# Patient Record
Sex: Female | Born: 1990 | Hispanic: Yes | Marital: Single | Smoking: Never smoker
Health system: Southern US, Community
[De-identification: ages and names within clinical notes are randomized; demographics above are authoritative.]

## PROBLEM LIST (undated history)

## (undated) ENCOUNTER — Inpatient Hospital Stay: Payer: Self-pay

## (undated) DIAGNOSIS — Z789 Other specified health status: Secondary | ICD-10-CM

## (undated) HISTORY — PX: NO PAST SURGERIES: SHX2092

---
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
## 2011-05-14 ENCOUNTER — Emergency Department: Payer: Self-pay | Admitting: *Deleted

## 2013-10-07 ENCOUNTER — Emergency Department: Payer: Self-pay | Admitting: Emergency Medicine

## 2014-04-25 ENCOUNTER — Observation Stay: Payer: Self-pay | Admitting: Obstetrics and Gynecology

## 2014-05-05 ENCOUNTER — Inpatient Hospital Stay: Payer: Self-pay | Admitting: Obstetrics and Gynecology

## 2014-05-05 LAB — CBC WITH DIFFERENTIAL/PLATELET
BASOS ABS: 0 10*3/uL (ref 0.0–0.1)
BASOS PCT: 0 %
EOS PCT: 0.2 %
Eosinophil #: 0 10*3/uL (ref 0.0–0.7)
HCT: 35.8 % (ref 35.0–47.0)
HGB: 11.7 g/dL — ABNORMAL LOW (ref 12.0–16.0)
Lymphocyte #: 2.3 10*3/uL (ref 1.0–3.6)
Lymphocyte %: 19.6 %
MCH: 26.5 pg (ref 26.0–34.0)
MCHC: 32.6 g/dL (ref 32.0–36.0)
MCV: 81 fL (ref 80–100)
Monocyte #: 0.7 x10 3/mm (ref 0.2–0.9)
Monocyte %: 5.6 %
NEUTROS ABS: 8.6 10*3/uL — AB (ref 1.4–6.5)
NEUTROS PCT: 74.6 %
Platelet: 130 10*3/uL — ABNORMAL LOW (ref 150–440)
RBC: 4.41 10*6/uL (ref 3.80–5.20)
RDW: 15.7 % — ABNORMAL HIGH (ref 11.5–14.5)
WBC: 11.6 10*3/uL — ABNORMAL HIGH (ref 3.6–11.0)

## 2014-05-05 LAB — GC/CHLAMYDIA PROBE AMP

## 2014-05-06 LAB — HEMATOCRIT: HCT: 31.2 % — ABNORMAL LOW (ref 35.0–47.0)

## 2015-03-11 NOTE — H&P (Signed)
L&D Evaluation:  History Expanded:  HPI 24 yo G3 P1102 who presents with some contractions. No ROM or vaginal bleeding.  Currently 38 2/[redacted] weeks EGA.  Prenatal Care at [facility].    She has had a previous preterm delivery at 35 weeks due to preeclampsia ans then a 37 week delivery. She is on baby asa a day for her risk prevention, Early US shows a fwetal kidney pelvis dilation and hydronephrosis, no genetic testing was recommended.  She is A pos/VI/RI She plans to use depo post delivery. Pt here to be evaluated for labor. she got tdap 4/21   Gravida 3   Term 1   PreTerm 1   Abortion 0   Living 2   Blood Type (Maternal) A positive   Group B Strep Results Maternal (Result >5wks must be treated as unknown) negative   Maternal HIV Negative   Maternal Syphilis Ab Nonreactive   Maternal Varicella Immune   Rubella Results (Maternal) immune   Maternal T-Dap Immune   [facility]   Presents with contractions   Patient's Medical History No Chronic Illness   Patient's Surgical History none   Medications Pre [place]atal Vitamins  baby asa   Allergies Intolerence Fe supplents increase her n/v   Social History none   Family History Non-Contributory   ROS:  ROS All systems were reviewed.  HEENT, CNS, GI, GU, Respiratory, CV, Renal and Musculoskeletal systems were found to be normal.   Exam:  Vital Signs stable   Urine Protein not completed   General no apparent distress   Mental Status clear   Chest clear   Heart normal sinus rhythm   Abdomen gravid, tender with contractions   Estimated Fetal Weight Average for gestational age   Fetal Position v   Fundal Height term   Back no CVAT   Edema no edema   Pelvic no external lesions, 5/80/-1   Mebranes Intact   FHT CAT 1   Ucx regular   Skin dry   Impression:  Impression early labor   Plan:  Plan EFM/NST, monitor contractions and for cervical change, monitor BP, fluids   Comments Labor plans  discussed Analgesia as needed AROM- clear   Electronic Signatures: Letitia LibraHarris, Robert Paul (MD)  (Signed 05-Jul-15 10:04)  Authored: L&D Evaluation   Last Updated: 05-Jul-15 10:04 by Letitia LibraHarris, Robert Paul (MD)

## 2015-03-11 NOTE — H&P (Signed)
L&D Evaluation:  History Expanded:  HPI 24 yo G3 P1102 who presnts with some contractions. She has had a previous preterm delivery at 35 weeks due to preeclampsia ans then a 37 week delivery. She is on baby asa a day for her risk prevention, Early US shows a fwetal kidney pelvis dilation and hydronephrosis, no genetic testing was recommended.  She is A pos/VI/RI She plans to use depo post delivery. Pt here to be evaluated for labor. she got tdap 4/21   Gravida 3   Term 1   PreTerm 1   Abortion 0   Living 2   Blood Type (Maternal) A positive   Group B Strep Results Maternal (Result >5wks must be treated as unknown) negative   Maternal HIV Negative   Maternal Syphilis Ab Nonreactive   Maternal Varicella Immune   Rubella Results (Maternal) immune   Maternal T-Dap Immune   [facility]   Presents with contractions   Patient's Medical History No Chronic Illness   Patient's Surgical History none   Medications Pre [place]atal Vitamins  baby asa   Allergies Intolersnce Fe supplents in crease her n/V   Social History none   Family History Non-Contributory   ROS:  ROS All systems were reviewed.  HEENT, CNS, GI, GU, Respiratory, CV, Renal and Musculoskeletal systems were found to be normal.   Exam:  Vital Signs stable   Urine Protein not completed   General no apparent distress   Mental Status clear   Chest clear   Heart normal sinus rhythm   Abdomen gravid, tender with contractions   Estimated Fetal Weight Small for gestational age   Fetal Position v   Fundal Height term   Back no CVAT   Edema no edema   Pelvic no external lesions, 1 cm per RN   Mebranes Intact   FHT CAT 1   Ucx irregular   Skin dry   Lymph no lymphadenopathy   Impression:  Impression early labor   Plan:  Plan EFM/NST, monitor contractions and for cervical change   Comments if no change then morphine IM and dc with followup   Follow Up Appointment need to schedule.  in 1 week   Electronic Signatures: Adria DevonKlett, Carrie (MD)  (Signed 25-Jun-15 21:52)  Authored: L&D Evaluation   Last Updated: 25-Jun-15 21:52 by Adria DevonKlett, Carrie (MD)

## 2015-09-18 ENCOUNTER — Other Ambulatory Visit: Payer: Self-pay | Admitting: Physician Assistant

## 2015-09-19 LAB — OB RESULTS CONSOLE ANTIBODY SCREEN: Antibody Screen: NEGATIVE

## 2015-09-19 LAB — OB RESULTS CONSOLE ABO/RH: RH Type: POSITIVE

## 2015-09-19 LAB — OB RESULTS CONSOLE VARICELLA ZOSTER ANTIBODY, IGG: VARICELLA IGG: IMMUNE

## 2015-09-19 LAB — OB RESULTS CONSOLE HIV ANTIBODY (ROUTINE TESTING): HIV: NONREACTIVE

## 2015-09-19 LAB — OB RESULTS CONSOLE RPR: RPR: NONREACTIVE

## 2015-09-19 LAB — OB RESULTS CONSOLE RUBELLA ANTIBODY, IGM: RUBELLA: IMMUNE

## 2015-09-19 LAB — OB RESULTS CONSOLE HEPATITIS B SURFACE ANTIGEN: Hepatitis B Surface Ag: NEGATIVE

## 2015-11-02 NOTE — L&D Delivery Note (Addendum)
Delivery Note At 11:58 PM a viable female was delivered via Vaginal, Spontaneous Delivery (Presentation: ; Occiput Anterior).  APGAR: 9,9 ; weight 9#3oz .   Placenta status: Intact, Spontaneous.  Cord: 3 vessels with the following complications: None.  Cord pH: N/A  Anesthesia: Epidural  Episiotomy: None Lacerations: None Suture Repair: none Est. Blood Loss (mL): 200  Quick second stage. Shoulder dystocia relieved by McRoberts maneuver.  Infant to maternal abdomen for immediate cord clamping, cut by FOB and infant taken to warmer by nursery staff.   Baby's Name: Kristin Jacobs  Mom to postpartum.  Baby to Couplet care / Skin to Skin.  [name], [name] 03/23/2016, 12:18 AM

## 2015-11-04 ENCOUNTER — Other Ambulatory Visit: Payer: Self-pay | Admitting: Advanced Practice Midwife

## 2015-11-04 DIAGNOSIS — O0992 Supervision of high risk pregnancy, unspecified, second trimester: Secondary | ICD-10-CM

## 2015-11-05 ENCOUNTER — Other Ambulatory Visit: Payer: Self-pay | Admitting: Advanced Practice Midwife

## 2015-11-05 DIAGNOSIS — Z3689 Encounter for other specified antenatal screening: Secondary | ICD-10-CM

## 2015-11-07 ENCOUNTER — Ambulatory Visit
Admission: RE | Admit: 2015-11-07 | Discharge: 2015-11-07 | Disposition: A | Discharge status: Home / Self Care | Payer: BLUE CROSS/BLUE SHIELD | Source: Ambulatory Visit | Attending: Advanced Practice Midwife | Admitting: Advanced Practice Midwife

## 2015-11-07 DIAGNOSIS — Z36 Encounter for antenatal screening of mother: Secondary | ICD-10-CM | POA: Diagnosis not present

## 2015-11-07 DIAGNOSIS — Z3689 Encounter for other specified antenatal screening: Secondary | ICD-10-CM

## 2016-02-22 ENCOUNTER — Observation Stay
Admission: EM | Admit: 2016-02-22 | Discharge: 2016-02-22 | Disposition: A | Discharge status: Home / Self Care | Payer: BLUE CROSS/BLUE SHIELD | Attending: Obstetrics and Gynecology | Admitting: Obstetrics and Gynecology

## 2016-02-22 DIAGNOSIS — O9989 Other specified diseases and conditions complicating pregnancy, childbirth and the puerperium: Secondary | ICD-10-CM | POA: Diagnosis present

## 2016-02-22 DIAGNOSIS — Z3A00 Weeks of gestation of pregnancy not specified: Secondary | ICD-10-CM | POA: Insufficient documentation

## 2016-02-22 DIAGNOSIS — R42 Dizziness and giddiness: Secondary | ICD-10-CM | POA: Diagnosis not present

## 2016-02-22 MED ORDER — ACETAMINOPHEN 325 MG PO TABS: 650.0000 mg | ORAL_TABLET | ORAL | Status: DC | PRN

## 2016-02-22 MED ORDER — CALCIUM CARBONATE ANTACID 500 MG PO CHEW: 2.0000 | CHEWABLE_TABLET | ORAL | Status: DC | PRN

## 2016-02-22 NOTE — Progress Notes (Signed)
No cervical dilation . Pt d/c home with d/c instructions. Dr Vergie Livingpickens made aware.

## 2016-02-22 NOTE — Plan of Care (Signed)
Pt presents to l/d with c/o dizziness , blurred vision and contractions 1 per hour. Also swelling in feet, more so than usual.

## 2016-02-27 LAB — OB RESULTS CONSOLE GBS: GBS: NEGATIVE

## 2016-02-28 NOTE — Discharge Summary (Signed)
OB Triage Discharge Summary   25 y.o. G4P3 @ term with dizziness.   Fetal status reassuring. EFM showed rNST and reassuring toco. Triage course: normal and stable vital signs and pt feeling better after PO hydration and eating  Labs pending: none RTC as scheduled Disposition: Home  Triage evaluation done remotely: Yes.    Cornelia Copaharlie Shiv Shuey, Jr MD [facility]  Pager: [phone]

## 2016-03-04 ENCOUNTER — Encounter: Payer: Self-pay | Admitting: *Deleted

## 2016-03-04 ENCOUNTER — Inpatient Hospital Stay
Admission: EM | Admit: 2016-03-04 | Discharge: 2016-03-04 | Disposition: A | Discharge status: Home / Self Care | Payer: BLUE CROSS/BLUE SHIELD | Attending: Advanced Practice Midwife | Admitting: Advanced Practice Midwife

## 2016-03-04 DIAGNOSIS — Z3A37 37 weeks gestation of pregnancy: Secondary | ICD-10-CM | POA: Insufficient documentation

## 2016-03-04 HISTORY — DX: Other specified health status: Z78.9

## 2016-03-04 NOTE — Progress Notes (Signed)
Discharge teaching done.  Questions answered. Signed copy of instructions on chart and one in hand. dicharged ambulatory with SO at side.

## 2016-03-04 NOTE — OB Triage Note (Signed)
[facility] of [facility]  Changed to gown and to bed.  EFM applied and oriented to room and plan of care.  Verbalized understanding.

## 2016-03-04 NOTE — Discharge Summary (Signed)
Physician Discharge Summary  Patient ID: Kristin Jacobs Record MRN: [id number] DOB/AGE: [birth year] 25 y.o.  Admit date: 03/04/2016 Discharge date: 03/04/2016  Admission Diagnoses: IUP at 442w4d with c/o contractions  Discharge Diagnoses:  Active Problems:   Indication for care in labor and delivery, antepartum IUP at 1542w4d with Reactive NST/Not in labor  Discharged Condition: good  Hospital Course: Admitted for observation to rule out labor/Pt placed on monitor/Cervical exam with recheck of Cervix 2 hours later  Consults: None  Significant Diagnostic Studies: none  Treatments: none Procedures: NST: Baseline 150 with moderate variability, + accelerations, - decelerations Toco: Contractions every 4-5 minutes Cervix: 1/50/-3/no change from earlier exam  Discharge Exam: Blood pressure 113/65, pulse 103, temperature 98.7 F (37.1 C), temperature source Oral, resp. rate 16, height 5\' 1"  (1.549 m), weight 79.833 kg (176 lb). General appearance: alert, cooperative and appears stated age  Disposition: home      Discharge Instructions    Discharge activity:  No Restrictions    Complete by:  As directed      Discharge diet:  No restrictions    Complete by:  As directed      Fetal Kick Count:  Lie on our left side for one hour after a meal, and count the number of times your baby kicks.  If it is less than 5 times, get up, move around and drink some juice.  Repeat the test 30 minutes later.  If it is still less than 5 kicks in an hour, notify your doctor.    Complete by:  As directed      LABOR:  When conractions begin, you should start to time them from the beginning of one contraction to the beginning  of the next.  When contractions are 5 - 10 minutes apart or less and have been regular for at least an hour, you should call your health care provider.    Complete by:  As directed      No sexual activity restrictions    Complete by:  As directed      Notify physician for bleeding from the  vagina    Complete by:  As directed      Notify physician for blurring of vision or spots before the eyes    Complete by:  As directed      Notify physician for chills or fever    Complete by:  As directed      Notify physician for fainting spells, "black outs" or loss of consciousness    Complete by:  As directed      Notify physician for increase in vaginal discharge    Complete by:  As directed      Notify physician for leaking of fluid    Complete by:  As directed      Notify physician for pain or burning when urinating    Complete by:  As directed      Notify physician for pelvic pressure (sudden increase)    Complete by:  As directed      Notify physician for severe or continued nausea or vomiting    Complete by:  As directed      Notify physician for sudden gushing of fluid from the vagina (with or without continued leaking)    Complete by:  As directed      Notify physician for sudden, constant, or occasional abdominal pain    Complete by:  As directed      Notify physician if baby moving less  than usual    Complete by:  As directed             Medication List    Notice    You have not been prescribed any medications.     Follow-up Information    Please follow up.   Why:  keep your regular scheduled prenatal appointment       Signed: Tresea Mall, CNM

## 2016-03-22 ENCOUNTER — Inpatient Hospital Stay: Payer: BLUE CROSS/BLUE SHIELD | Admitting: Anesthesiology

## 2016-03-22 ENCOUNTER — Inpatient Hospital Stay
Admission: RE | Admit: 2016-03-22 | Discharge: 2016-03-24 | DRG: 774 | Disposition: A | Discharge status: Home / Self Care | Payer: BLUE CROSS/BLUE SHIELD | Attending: Advanced Practice Midwife | Admitting: Advanced Practice Midwife

## 2016-03-22 ENCOUNTER — Encounter: Payer: Self-pay | Admitting: Advanced Practice Midwife

## 2016-03-22 DIAGNOSIS — Z888 Allergy status to other drugs, medicaments and biological substances status: Secondary | ICD-10-CM | POA: Diagnosis not present

## 2016-03-22 DIAGNOSIS — O368190 Decreased fetal movements, unspecified trimester, not applicable or unspecified: Secondary | ICD-10-CM | POA: Diagnosis present

## 2016-03-22 DIAGNOSIS — O135 Gestational [pregnancy-induced] hypertension without significant proteinuria, complicating the puerperium: Secondary | ICD-10-CM | POA: Diagnosis not present

## 2016-03-22 DIAGNOSIS — Z3A40 40 weeks gestation of pregnancy: Secondary | ICD-10-CM

## 2016-03-22 DIAGNOSIS — E876 Hypokalemia: Secondary | ICD-10-CM | POA: Diagnosis not present

## 2016-03-22 DIAGNOSIS — D62 Acute posthemorrhagic anemia: Secondary | ICD-10-CM | POA: Diagnosis not present

## 2016-03-22 DIAGNOSIS — O368130 Decreased fetal movements, third trimester, not applicable or unspecified: Principal | ICD-10-CM | POA: Diagnosis present

## 2016-03-22 LAB — CBC WITH DIFFERENTIAL/PLATELET
Basophils Absolute: 0 10*3/uL (ref 0–0.1)
Basophils Relative: 0 %
EOS ABS: 0 10*3/uL (ref 0–0.7)
Eosinophils Relative: 0 %
HEMATOCRIT: 35 % (ref 35.0–47.0)
HEMOGLOBIN: 11.3 g/dL — AB (ref 12.0–16.0)
LYMPHS ABS: 1.6 10*3/uL (ref 1.0–3.6)
MCH: 24.6 pg — AB (ref 26.0–34.0)
MCHC: 32.3 g/dL (ref 32.0–36.0)
MCV: 76.2 fL — AB (ref 80.0–100.0)
Monocytes Absolute: 0.9 10*3/uL (ref 0.2–0.9)
NEUTROS ABS: 7.6 10*3/uL — AB (ref 1.4–6.5)
Platelets: 167 10*3/uL (ref 150–440)
RBC: 4.6 MIL/uL (ref 3.80–5.20)
RDW: 18 % — ABNORMAL HIGH (ref 11.5–14.5)
WBC: 10.1 10*3/uL (ref 3.6–11.0)

## 2016-03-22 LAB — ABO/RH: ABO/RH(D): A POS

## 2016-03-22 LAB — TYPE AND SCREEN
ABO/RH(D): A POS
ANTIBODY SCREEN: NEGATIVE

## 2016-03-22 LAB — CHLAMYDIA/NGC RT PCR (ARMC ONLY)
Chlamydia Tr: NOT DETECTED
N gonorrhoeae: NOT DETECTED

## 2016-03-22 MED ORDER — NALBUPHINE HCL 10 MG/ML IJ SOLN: 5.0000 mg | INTRAMUSCULAR | Status: DC | PRN

## 2016-03-22 MED ORDER — DIPHENHYDRAMINE HCL 50 MG/ML IJ SOLN
12.5000 mg | INTRAMUSCULAR | Status: DC | PRN
Start: 2016-03-22 — End: 2016-03-23

## 2016-03-22 MED ORDER — LIDOCAINE HCL (PF) 1 % IJ SOLN
INTRAMUSCULAR | Status: AC
  Administered 2016-03-22: 1 mL via INTRADERMAL
  Filled 2016-03-22: qty 30

## 2016-03-22 MED ORDER — SODIUM CHLORIDE 0.9% FLUSH: 3.0000 mL | INTRAVENOUS | Status: DC | PRN

## 2016-03-22 MED ORDER — OXYTOCIN 40 UNITS IN LACTATED RINGERS INFUSION - SIMPLE MED
2.5000 [IU]/h | INTRAVENOUS | Status: DC
  Filled 2016-03-22: qty 1000

## 2016-03-22 MED ORDER — OXYTOCIN BOLUS FROM INFUSION
500.0000 mL | INTRAVENOUS | Status: DC
Start: 2016-03-22 — End: 2016-03-23

## 2016-03-22 MED ORDER — LACTATED RINGERS IV SOLN
INTRAVENOUS | Status: DC
  Administered 2016-03-22: 1000 mL via INTRAVENOUS

## 2016-03-22 MED ORDER — BUTORPHANOL TARTRATE 1 MG/ML IJ SOLN
2.0000 mg | INTRAMUSCULAR | Status: DC | PRN
  Filled 2016-03-22: qty 1

## 2016-03-22 MED ORDER — NALBUPHINE HCL 10 MG/ML IJ SOLN: 5.0000 mg | Freq: Once | INTRAMUSCULAR | Status: DC | PRN

## 2016-03-22 MED ORDER — ONDANSETRON HCL 4 MG/2ML IJ SOLN: 4.0000 mg | Freq: Four times a day (QID) | INTRAMUSCULAR | Status: DC | PRN

## 2016-03-22 MED ORDER — NALOXONE HCL 2 MG/2ML IJ SOSY
1.0000 ug/kg/h | PREFILLED_SYRINGE | INTRAVENOUS | Status: DC | PRN
  Filled 2016-03-22: qty 2

## 2016-03-22 MED ORDER — LIDOCAINE-EPINEPHRINE (PF) 1.5 %-1:200000 IJ SOLN
INTRAMUSCULAR | Status: DC | PRN
  Administered 2016-03-22: 3 mL via EPIDURAL

## 2016-03-22 MED ORDER — BUTORPHANOL TARTRATE 1 MG/ML IJ SOLN
1.0000 mg | Freq: Once | INTRAMUSCULAR | Status: AC
  Administered 2016-03-22: 1 mg via INTRAVENOUS

## 2016-03-22 MED ORDER — ACETAMINOPHEN 325 MG PO TABS
650.0000 mg | ORAL_TABLET | ORAL | Status: DC | PRN
Start: 2016-03-22 — End: 2016-03-22

## 2016-03-22 MED ORDER — AMMONIA AROMATIC IN INHA
RESPIRATORY_TRACT | Status: AC
  Filled 2016-03-22: qty 10

## 2016-03-22 MED ORDER — MISOPROSTOL 200 MCG PO TABS
ORAL_TABLET | ORAL | Status: AC
  Filled 2016-03-22: qty 4

## 2016-03-22 MED ORDER — FENTANYL 2.5 MCG/ML W/ROPIVACAINE 0.2% IN NS 100 ML EPIDURAL INFUSION (ARMC-ANES)
10.0000 mL/h | EPIDURAL | Status: DC
  Administered 2016-03-22: 10 mL/h via EPIDURAL

## 2016-03-22 MED ORDER — LACTATED RINGERS IV SOLN: 500.0000 mL | INTRAVENOUS | Status: DC | PRN

## 2016-03-22 MED ORDER — CALCIUM CARBONATE ANTACID 500 MG PO CHEW: 2.0000 | CHEWABLE_TABLET | ORAL | Status: DC | PRN

## 2016-03-22 MED ORDER — FENTANYL 2.5 MCG/ML W/ROPIVACAINE 0.2% IN NS 100 ML EPIDURAL INFUSION (ARMC-ANES)
EPIDURAL | Status: AC
  Filled 2016-03-22: qty 100

## 2016-03-22 MED ORDER — NALOXONE HCL 0.4 MG/ML IJ SOLN: 0.4000 mg | INTRAMUSCULAR | Status: DC | PRN

## 2016-03-22 MED ORDER — BUPIVACAINE HCL (PF) 0.25 % IJ SOLN
INTRAMUSCULAR | Status: DC | PRN
  Administered 2016-03-22: 5 mL via EPIDURAL

## 2016-03-22 MED ORDER — ACETAMINOPHEN 325 MG PO TABS: 650.0000 mg | ORAL_TABLET | ORAL | Status: DC | PRN

## 2016-03-22 MED ORDER — DIPHENHYDRAMINE HCL 25 MG PO CAPS: 25.0000 mg | ORAL_CAPSULE | ORAL | Status: DC | PRN

## 2016-03-22 MED ORDER — OXYTOCIN 10 UNIT/ML IJ SOLN
INTRAMUSCULAR | Status: AC
  Filled 2016-03-22: qty 2

## 2016-03-22 NOTE — Anesthesia Procedure Notes (Signed)
Epidural Patient location during procedure: OB Start time: 03/22/2016 8:40 PM End time: 03/22/2016 8:42 PM  Staffing Anesthesiologist: Margorie JohnPISCITELLO, Nahuel Wilbert K Performed by: anesthesiologist   Preanesthetic Checklist Completed: patient identified, site marked, surgical consent, pre-op evaluation, timeout performed, IV checked, risks and benefits discussed and monitors and equipment checked  Epidural Patient position: sitting Prep: Betadine Patient monitoring: heart rate, continuous pulse ox and blood pressure Approach: midline Location: L4-L5 Injection technique: LOR saline  Needle:  Needle type: Tuohy  Needle gauge: 17 G Needle length: 9 cm and 9 Needle insertion depth: 6 cm Catheter type: closed end flexible Catheter size: 19 Gauge Catheter at skin depth: 11 cm Test dose: negative and 1.5% lidocaine with Epi 1:200 K  Assessment Sensory level: T10 Events: blood not aspirated, injection not painful, no injection resistance, negative IV test and no paresthesia  Additional Notes Patient identified. Risks/Benefits/Options discussed with patient including but not limited to bleeding, infection, nerve damage, paralysis, failed block, incomplete pain control, headache, blood pressure changes, nausea, vomiting, reactions to medication both or allergic, itching and postpartum back pain. Confirmed with bedside nurse the patient's most recent platelet count. Confirmed with patient that they are not currently taking any anticoagulation, have any bleeding history or any family history of bleeding disorders. Patient expressed understanding and wished to proceed. All questions were answered. Sterile technique was used throughout the entire procedure. Please see nursing notes for vital signs. Test dose was given through epidural catheter and negative prior to continuing to dose epidural or start infusion. Warning signs of high block given to the patient including shortness of breath, tingling/numbness  in hands, complete motor block, or any concerning symptoms with instructions to call for help. Patient was given instructions on fall risk and not to get out of bed. All questions and concerns addressed with instructions to call with any issues or inadequate analgesia.   Patient tolerated the insertion well without immediate complications.Reason for block:procedure for pain

## 2016-03-22 NOTE — OB Triage Note (Signed)
Here for monitoriing due to complaint of decreased FM.  Pt seen at [facility] this morning and states that she was 3cm dilated and that the provider stripped her membranes.  Denies bleeding.

## 2016-03-22 NOTE — Anesthesia Preprocedure Evaluation (Signed)
Anesthesia Evaluation  Patient identified by MRN, date of birth, ID band Patient awake    Reviewed: Allergy & Precautions, H&P , NPO status , Patient's Chart, lab work & pertinent test results  Airway Mallampati: III  TM Distance: >3 FB Neck ROM: full    Dental  (+) Poor Dentition   Pulmonary neg pulmonary ROS, neg shortness of breath,    Pulmonary exam normal breath sounds clear to auscultation       Cardiovascular Exercise Tolerance: Good (-) hypertensionnegative cardio ROS Normal cardiovascular exam Rhythm:regular Rate:Normal     Neuro/Psych    GI/Hepatic negative GI ROS,   Endo/Other    Renal/GU   negative genitourinary   Musculoskeletal   Abdominal   Peds  Hematology negative hematology ROS (+)   Anesthesia Other Findings Past Medical History:   Medical history non-contributory                            Past Surgical History:   NO PAST SURGERIES                                            BMI    Body Mass Index   35.16 kg/m 2      Reproductive/Obstetrics (+) Pregnancy                             Anesthesia Physical Anesthesia Plan  ASA: II  Anesthesia Plan: Epidural   Post-op Pain Management:    Induction:   Airway Management Planned:   Additional Equipment:   Intra-op Plan:   Post-operative Plan:   Informed Consent: I have reviewed the patients History and Physical, chart, labs and discussed the procedure including the risks, benefits and alternatives for the proposed anesthesia with the patient or authorized representative who has indicated his/her understanding and acceptance.     Plan Discussed with: Anesthesiologist  Anesthesia Plan Comments:         Anesthesia Quick Evaluation

## 2016-03-22 NOTE — Progress Notes (Signed)
L&D Note  03/22/2016 - 6:36 PM  24 y.o. [id number] 3042w1d   [name] is admitted for labor   Subjective:  Feeling more painful ctx  Objective:   Filed Vitals:   03/22/16 1228 03/22/16 1536 03/22/16 1641 03/22/16 1644  BP: 129/66 138/85  132/63  Pulse: 96 80  78  Temp: 98.2 F (36.8 C)  98.1 F (36.7 C)   TempSrc: Oral  Oral   Resp: 20  18   Height: 5\' 1"  (1.549 m)     Weight: 186 lb (84.369 kg)       Current Vital Signs 24h Vital Sign Ranges  T 98.1 F (36.7 C) Temp  Avg: 98.2 F (36.8 C)  Min: 98.1 F (36.7 C)  Max: 98.2 F (36.8 C)  BP 132/63 mmHg BP  Min: 129/66  Max: 138/85  HR 78 Pulse  Avg: 84.7  Min: 78  Max: 96  RR 18 Resp  Avg: 19  Min: 18  Max: 20  SaO2     No Data Recorded       24 Hour I/O Current Shift I/O  Time Ins Outs        FHR: category 1 tracing Toco: q 2-4 min SVE: 6/90/-2   Assessment :  IUP at 4842w1d, labor    Plan:  AROM with small amount of meconium stained amniotic fluid noted Pain management per pt Anticipate vaginal delivery  [name], Ruby Dilone, [place]

## 2016-03-22 NOTE — H&P (Signed)
Obstetric History and Physical  Kristin Jacobs is a 25 y.o. [phone] with Estimated Date of Delivery: 03/21/16 per 15 week [place] who presents at [redacted]w[redacted]d  presenting for decreased FM. Pt was also checked at [facility] today and had membranes stripped. She was 3 cm at that time. Patient states she has been having mild contractions, no vaginal bleeding, intact membranes, with active fetal movement once on monitor.   Prenatal Course Source of Care: [facility]  with onset of care at 12 weeks Pregnancy complications or risks: Patient Active Problem List   Diagnosis Date Noted  . Decreased fetal movement 03/22/2016  . Indication for care in labor and delivery, antepartum 03/04/2016  . Dizziness, nonspecific 02/22/2016   She plans to bottle feed She desires Nexplanon for postpartum contraception.   Prenatal labs and studies: ABO, Rh: A+  Antibody: negative Rubella: Immune Varicella: Immune RPR:  NR HBsAg:  Neg HIV: Neg GC/CT: Neg/Neg GBS: Negative 1 hr Glucola: 126   Genetic screening: Quad negative  TDAP: UTD   Prenatal Transfer Tool   Past Medical History  Diagnosis Date  . Medical history non-contributory     Past Surgical History  Procedure Laterality Date  . No past surgeries      OB History  Gravida Para Term Preterm AB SAB TAB Ectopic Multiple Living  # Outcome Date GA Lbr Len/2nd Weight Sex Delivery Anes PTL Lv  4 Current           3 Term 05/05/14          2 Term 06/27/10          1 Preterm 04/29/09              Social History   Social History  . Marital Status: Single    Spouse Name: N/A  . Number of Children: N/A  . Years of Education: N/A   Social History Main Topics  . Smoking status: Never Smoker   . Smokeless tobacco: None  . Alcohol Use: No  . Drug Use: No  . Sexual Activity: Yes   Other Topics Concern  . None   Social History Narrative    History reviewed. No pertinent family history.  Prescriptions prior to admission  Medication  Sig Dispense Refill Last Dose  . Prenatal Vit-Fe Fumarate-FA (PRENATAL MULTIVITAMIN) TABS tablet Take 1 tablet by mouth daily at 12 noon.       Allergies  Allergen Reactions  . Iron Nausea And Vomiting    Review of Systems: Negative except for what is mentioned in HPI.  Physical Exam: BP 129/66 mmHg  Pulse 96  Temp(Src) 98.2 F (36.8 C) (Oral)  Resp 20  Ht  (1.549 m)  Wt 186 lb (84.369 kg)  BMI 35.16 kg/m2 GENERAL: Well-developed, well-nourished female in no acute distress.  ABDOMEN: Soft, nontender, nondistended, gravid. EXTREMITIES: Nontender, no edema Cervical Exam: Dilatation 6 cm   Effacement 60 %   Station -2 (4-5 cm two hours ago)   Presentation: cephalic FHT: Category: 1 Baseline rate 150 bpm   Variability moderate  Accelerations present   Decelerations none Contractions: q 2-4 min   Pertinent Labs/Studies:   No results found for this or any previous visit (from the past 24 hour(s)).  Assessment : IUP at [redacted]w[redacted]d, reassuring fetal tracing, labor  Plan: Admit for labor and Observe for cervical change  Pain management - IV stadol or epidural when desired  May  continue ambulating with intermittant monitoring if desired until pain medication

## 2016-03-23 ENCOUNTER — Encounter: Payer: Self-pay | Admitting: Advanced Practice Midwife

## 2016-03-23 LAB — COMPREHENSIVE METABOLIC PANEL
ALK PHOS: 184 U/L — AB (ref 38–126)
ALT: 14 U/L (ref 14–54)
ANION GAP: 6 (ref 5–15)
AST: 21 U/L (ref 15–41)
Albumin: 2.5 g/dL — ABNORMAL LOW (ref 3.5–5.0)
BILIRUBIN TOTAL: 0.4 mg/dL (ref 0.3–1.2)
BUN: 6 mg/dL (ref 6–20)
CALCIUM: 8.6 mg/dL — AB (ref 8.9–10.3)
CO2: 23 mmol/L (ref 22–32)
CREATININE: 0.4 mg/dL — AB (ref 0.44–1.00)
Chloride: 107 mmol/L (ref 101–111)
Glucose, Bld: 132 mg/dL — ABNORMAL HIGH (ref 65–99)
Potassium: 2.9 mmol/L — CL (ref 3.5–5.1)
SODIUM: 136 mmol/L (ref 135–145)
Total Protein: 5.7 g/dL — ABNORMAL LOW (ref 6.5–8.1)

## 2016-03-23 LAB — CBC
HEMATOCRIT: 32.6 % — AB (ref 35.0–47.0)
Hemoglobin: 10.5 g/dL — ABNORMAL LOW (ref 12.0–16.0)
MCH: 24.4 pg — AB (ref 26.0–34.0)
MCHC: 32 g/dL (ref 32.0–36.0)
MCV: 76 fL — AB (ref 80.0–100.0)
Platelets: 142 10*3/uL — ABNORMAL LOW (ref 150–440)
RBC: 4.29 MIL/uL (ref 3.80–5.20)
RDW: 17.8 % — AB (ref 11.5–14.5)
WBC: 15.5 10*3/uL — AB (ref 3.6–11.0)

## 2016-03-23 LAB — PROTEIN / CREATININE RATIO, URINE
Creatinine, Urine: 69 mg/dL
PROTEIN CREATININE RATIO: 0.23 mg/mg{creat} — AB (ref 0.00–0.15)
Total Protein, Urine: 16 mg/dL

## 2016-03-23 LAB — RPR: RPR: NONREACTIVE

## 2016-03-23 LAB — POTASSIUM: Potassium: 4 mmol/L (ref 3.5–5.1)

## 2016-03-23 MED ORDER — ACETAMINOPHEN 325 MG PO TABS: 650.0000 mg | ORAL_TABLET | ORAL | Status: DC | PRN

## 2016-03-23 MED ORDER — COCONUT OIL OIL: 1.0000 "application " | TOPICAL_OIL | Status: DC | PRN

## 2016-03-23 MED ORDER — IBUPROFEN 600 MG PO TABS
600.0000 mg | ORAL_TABLET | Freq: Four times a day (QID) | ORAL | Status: DC
  Administered 2016-03-23 – 2016-03-24 (×6): 600 mg via ORAL
  Filled 2016-03-23 (×5): qty 1

## 2016-03-23 MED ORDER — ONDANSETRON HCL 4 MG/2ML IJ SOLN: 4.0000 mg | INTRAMUSCULAR | Status: DC | PRN

## 2016-03-23 MED ORDER — OXYCODONE-ACETAMINOPHEN 5-325 MG PO TABS
2.0000 | ORAL_TABLET | ORAL | Status: DC | PRN
  Administered 2016-03-23: 2 via ORAL
  Filled 2016-03-23: qty 2

## 2016-03-23 MED ORDER — WITCH HAZEL-GLYCERIN EX PADS: 1.0000 "application " | MEDICATED_PAD | CUTANEOUS | Status: DC | PRN

## 2016-03-23 MED ORDER — SIMETHICONE 80 MG PO CHEW: 80.0000 mg | CHEWABLE_TABLET | ORAL | Status: DC | PRN

## 2016-03-23 MED ORDER — DIBUCAINE 1 % RE OINT: 1.0000 "application " | TOPICAL_OINTMENT | RECTAL | Status: DC | PRN

## 2016-03-23 MED ORDER — DIPHENHYDRAMINE HCL 25 MG PO CAPS
25.0000 mg | ORAL_CAPSULE | Freq: Four times a day (QID) | ORAL | Status: DC | PRN
Start: 2016-03-23 — End: 2016-03-24

## 2016-03-23 MED ORDER — ZOLPIDEM TARTRATE 5 MG PO TABS: 5.0000 mg | ORAL_TABLET | Freq: Every evening | ORAL | Status: DC | PRN

## 2016-03-23 MED ORDER — DOCUSATE SODIUM 100 MG PO CAPS: 100.0000 mg | ORAL_CAPSULE | Freq: Two times a day (BID) | ORAL | Status: DC | PRN

## 2016-03-23 MED ORDER — POTASSIUM CHLORIDE 10 MEQ/100ML IV SOLN
10.0000 meq | INTRAVENOUS | Status: AC
  Administered 2016-03-23 (×4): 10 meq via INTRAVENOUS
  Filled 2016-03-23 (×4): qty 100

## 2016-03-23 MED ORDER — IBUPROFEN 600 MG PO TABS
ORAL_TABLET | ORAL | Status: AC
  Administered 2016-03-23: 600 mg via ORAL
  Filled 2016-03-23: qty 1

## 2016-03-23 MED ORDER — BENZOCAINE-MENTHOL 20-0.5 % EX AERO: 1.0000 "application " | INHALATION_SPRAY | CUTANEOUS | Status: DC | PRN

## 2016-03-23 MED ORDER — OXYCODONE-ACETAMINOPHEN 5-325 MG PO TABS
1.0000 | ORAL_TABLET | ORAL | Status: DC | PRN
  Administered 2016-03-23 – 2016-03-24 (×5): 1 via ORAL
  Filled 2016-03-23 (×5): qty 1

## 2016-03-23 MED ORDER — LACTATED RINGERS IV SOLN: INTRAVENOUS | Status: DC

## 2016-03-23 MED ORDER — ONDANSETRON HCL 4 MG PO TABS: 4.0000 mg | ORAL_TABLET | ORAL | Status: DC | PRN

## 2016-03-23 NOTE — Anesthesia Postprocedure Evaluation (Signed)
Anesthesia Post Note  Patient: Kristin ChenMariela Demedeiros  Procedure(s) Performed: * No procedures listed *  Patient location during evaluation: Mother Baby Anesthesia Type: Epidural Level of consciousness: awake and alert and oriented Pain management: satisfactory to patient Vital Signs Assessment: post-procedure vital signs reviewed and stable Respiratory status: respiratory function stable Cardiovascular status: stable Postop Assessment: no backache, no headache, epidural receding, patient able to bend at knees, no signs of nausea or vomiting and adequate PO intake Anesthetic complications: no    Last Vitals:  Filed Vitals:   03/23/16 0332 03/23/16 0400  BP:  114/64  Pulse:  84  Temp: 37 C 36.6 C  Resp:  18    Last Pain:  Filed Vitals:   03/23/16 0713  PainSc: 2                  Clydene PughBeane, Glorian Mcdonell D

## 2016-03-23 NOTE — Discharge Summary (Signed)
Obstetrical Discharge Summary  Date of Admission: 03/22/2016 Date of Discharge: 03/24/16  Primary OB: [facility]  Gestational Age at Delivery: 284w1d   Antepartum complications: none Reason for Admission: Labor Date of Delivery: 03/22/16  Delivered By: T. Brothers, CNM Delivery Type: spontaneous vaginal delivery Intrapartum complications/course: Meconium stained amniotic fluid Anesthesia: epidural Placenta: Delivered and expressed via active management. Intact: yes. To pathology: no.  Laceration: none Episiotomy: none EBL: 200 Baby: Liveborn female, APGARs 9/9, weight 4180 g.   Discharge Diagnosis: Delivered.   Postpartum course: Mild range BP's at the end of labor and immediate postpartum. Labs: K: 4; Hgb 10.8. Discharge Vital Signs:  Current Vital Signs 24h Vital Sign Ranges  T 98 F (36.7 C) Temp  Avg: 98.2 F (36.8 C)  Min: 98 F (36.7 C)  Max: 98.5 F (36.9 C)  BP 123/85 mmHg BP  Min: 119/67  Max: 123/85  HR 72 Pulse  Avg: 72.3  Min: 65  Max: 80  RR 18 Resp  Avg: 17.3  Min: 16  Max: 18  SaO2 100 % Not Delivered SpO2  Avg: 99.5 %  Min: 99 %  Max: 100 %       24 Hour I/O Current Shift I/O  Time Ins Outs 05/23 0701 - 05/24 0700 In: -  Out: 400 [Urine:400]     Discharge Exam:  NAD Perineum: intact Abdomen: firm fundus below the umbilicus.  RRR no MRGs CTAB Ext: no c/c/e  Disposition: Home  Rh Immune globulin given: not applicable Rubella vaccine given: not applicable Tdap vaccine given in AP or PP setting: yes  Contraception: Nexplanon  Prenatal/Postnatal Panel: A POS//Rubella Immune//Varicella Immune//RPR negative//HIV negative/HepB Surface Ag negative//pap no abnormalities//plans to bottle feed  Plan:  Kristin ChenMariela Jacobs was discharged to home in good condition. Follow-up appointment with [facility] in 6 weeks for a postpartum visit  Discharge Medications:   Medication List    TAKE these medications        ibuprofen 600 MG tablet  Commonly known as:   ADVIL,MOTRIN  Take 1 tablet (600 mg total) by mouth every 6 (six) hours.     oxyCODONE-acetaminophen 5-325 MG tablet  Commonly known as:  PERCOCET/ROXICET  Take 1 tablet by mouth every 4 (four) hours as needed (pain scale 4-7).     prenatal multivitamin Tabs tablet  Take 1 tablet by mouth daily at 12 noon.

## 2016-03-23 NOTE — Progress Notes (Signed)
Spoke to Dr. Bonney AidStaebler to report K+ level 4.0 after IV piggyback administration X4---instructed me to remove IV.

## 2016-03-23 NOTE — Treatment Plan (Signed)
TBrothers, CNM notified ay [phone] of potassium level of 2.9. She is currently putting in orders for IV potassium.

## 2016-03-23 NOTE — Progress Notes (Signed)
Subjective:  Doing well, no complaints.  Minimal lochia.  No HA, vision changes, RUQ or epigastric pain  Objective:   Blood pressure 128/67, pulse 84, temperature 98.2 F (36.8 C), temperature source Oral, resp. rate 18, height 5' 1"  (1.549 m), weight 84.369 kg (186 lb), SpO2 100 %, unknown if currently breastfeeding.  General: NAD Pulmonary: no increased work of breathing Abdomen: non-distended, non-tender, fundus firm at level of umbilicus Extremities: no edema, no erythema, no tenderness  Results for orders placed or performed during the hospital encounter of 03/22/16 (from the past 72 hour(s))  CBC with Differential/Platelet     Status: Abnormal   Collection Time: 03/22/16  5:16 PM  Result Value Ref Range   WBC 10.1 3.6 - 11.0 K/uL   RBC 4.60 3.80 - 5.20 MIL/uL   Hemoglobin 11.3 (L) 12.0 - 16.0 g/dL   HCT 35.0 35.0 - 47.0 %   MCV 76.2 (L) 80.0 - 100.0 fL   MCH 24.6 (L) 26.0 - 34.0 pg   MCHC 32.3 32.0 - 36.0 g/dL   RDW 18.0 (H) 11.5 - 14.5 %   Platelets 167 150 - 440 K/uL   Neutrophils Relative % 75% %   Neutro Abs 7.6 (H) 1.4 - 6.5 K/uL   Lymphocytes Relative 16% %   Lymphs Abs 1.6 1.0 - 3.6 K/uL   Monocytes Relative 9% %   Monocytes Absolute 0.9 0.2 - 0.9 K/uL   Eosinophils Relative 0% %   Eosinophils Absolute 0.0 0 - 0.7 K/uL   Basophils Relative 0% %   Basophils Absolute 0.0 0 - 0.1 K/uL  RPR     Status: None   Collection Time: 03/22/16  5:16 PM  Result Value Ref Range   RPR Ser Ql Non Reactive Non Reactive    Comment: (NOTE) Performed At: [facility] [place], [place] [id number] Lindon Romp MD QM:[id number]   [place] rt PCR [facility] only)     Status: None   Collection Time: 03/22/16  5:16 PM  Result Value Ref Range   Specimen source GC/Chlam URINE, RANDOM    Chlamydia Tr NOT DETECTED NOT DETECTED   N gonorrhoeae NOT DETECTED NOT DETECTED    Comment: (NOTE) 100  This methodology has not been evaluated in pregnant women or  in 200  patients with a history of hysterectomy. 300 400  This methodology will not be performed on patients less than 45  years of age.   Type and screen [place]     Status: None   Collection Time: 03/22/16  5:17 PM  Result Value Ref Range   ABO/RH(D) A POS    Antibody Screen NEG    Sample Expiration 03/25/2016   ABO/Rh     Status: None   Collection Time: 03/22/16  5:18 PM  Result Value Ref Range   ABO/RH(D) A POS   Protein / creatinine ratio, urine     Status: Abnormal   Collection Time: 03/22/16  5:20 PM  Result Value Ref Range   Creatinine, Urine 69 mg/dL   Total Protein, Urine 16 mg/dL    Comment: NO NORMAL RANGE ESTABLISHED FOR THIS TEST   Protein Creatinine Ratio 0.23 (H) 0.00 - 0.15 mg/mg[Cre]  Comprehensive metabolic panel     Status: Abnormal   Collection Time: 03/23/16  4:57 AM  Result Value Ref Range   Sodium 136 135 - 145 mmol/L   Potassium 2.9 (LL) 3.5 - 5.1 mmol/L    Comment: CRITICAL RESULT CALLED  TO, READ BACK BY AND VERIFIED WITH TERRI SMITH @ [phone] ON 03/23/2016 BY CAF    Chloride 107 101 - 111 mmol/L   CO2 23 22 - 32 mmol/L   Glucose, Bld 132 (H) 65 - 99 mg/dL   BUN 6 6 - 20 mg/dL   Creatinine, Ser 0.40 (L) 0.44 - 1.00 mg/dL   Calcium 8.6 (L) 8.9 - 10.3 mg/dL   Total Protein 5.7 (L) 6.5 - 8.1 g/dL   Albumin 2.5 (L) 3.5 - 5.0 g/dL   AST 21 15 - 41 U/L   ALT 14 14 - 54 U/L   Alkaline Phosphatase 184 (H) 38 - 126 U/L   Total Bilirubin 0.4 0.3 - 1.2 mg/dL   GFR calc non Af Amer >60 >60 mL/min   GFR calc Af Amer >60 >60 mL/min    Comment: (NOTE) The eGFR has been calculated using the CKD EPI equation. This calculation has not been validated in all clinical situations. eGFR's persistently <60 mL/min signify possible Chronic Kidney Disease.    Anion gap 6 5 - 15  CBC     Status: Abnormal   Collection Time: 03/23/16  4:57 AM  Result Value Ref Range   WBC 15.5 (H) 3.6 - 11.0 K/uL   RBC 4.29 3.80 - 5.20 MIL/uL   Hemoglobin 10.5  (L) 12.0 - 16.0 g/dL   HCT 32.6 (L) 35.0 - 47.0 %   MCV 76.0 (L) 80.0 - 100.0 fL   MCH 24.4 (L) 26.0 - 34.0 pg   MCHC 32.0 32.0 - 36.0 g/dL   RDW 17.8 (H) 11.5 - 14.5 %   Platelets 142 (L) 150 - 440 K/uL    Assessment:   25 y.o. [id number] postpartum day # 1 TSVD, GHTN  Plan:    1) Acute blood loss anemia - hemodynamically stable and asymptomatic - po ferrous sulfate  2) --/--/A POS (05/22 1718) / Immune (11/18 0000) / Varicella Immune  3) TDAP status up to date  4) Bottle/Nexplanon  5) Hypokalemia - repleations ordered this AM recheck K this afternoon  6) GHTN - normotensive to mild range since delivery, will monitor BP's  7) Disposition anticipate discharge PPD2

## 2016-03-24 MED ORDER — IBUPROFEN 600 MG PO TABS: 600.0000 mg | ORAL_TABLET | Freq: Four times a day (QID) | ORAL | Status: AC

## 2016-03-24 MED ORDER — OXYCODONE-ACETAMINOPHEN 5-325 MG PO TABS: 1.0000 | ORAL_TABLET | ORAL | Status: AC | PRN

## 2016-03-24 NOTE — Progress Notes (Signed)
Subjective:  Doing well, no complaints.  Minimal lochia.  Objective:   Blood pressures 120s/60s, pulse 84, temperature 98.2 F (36.8 C), temperature source Oral, resp. rate 18, height 5' 1"  (1.549 m), weight 84.369 kg (186 lb), SpO2 100 %,  General: NAD Pulmonary: no increased work of breathing Abdomen: non-distended, non-tender, fundus firm at level of umbilicus Extremities: no edema, no erythema, no tenderness  Results for orders placed or performed during the hospital encounter of 03/22/16 (from the past 72 hour(s))  CBC with Differential/Platelet     Status: Abnormal   Collection Time: 03/22/16  5:16 PM  Result Value Ref Range   WBC 10.1 3.6 - 11.0 K/uL   RBC 4.60 3.80 - 5.20 MIL/uL   Hemoglobin 11.3 (L) 12.0 - 16.0 g/dL   HCT 35.0 35.0 - 47.0 %   MCV 76.2 (L) 80.0 - 100.0 fL   MCH 24.6 (L) 26.0 - 34.0 pg   MCHC 32.3 32.0 - 36.0 g/dL   RDW 18.0 (H) 11.5 - 14.5 %   Platelets 167 150 - 440 K/uL   Neutrophils Relative % 75% %   Neutro Abs 7.6 (H) 1.4 - 6.5 K/uL   Lymphocytes Relative 16% %   Lymphs Abs 1.6 1.0 - 3.6 K/uL   Monocytes Relative 9% %   Monocytes Absolute 0.9 0.2 - 0.9 K/uL   Eosinophils Relative 0% %   Eosinophils Absolute 0.0 0 - 0.7 K/uL   Basophils Relative 0% %   Basophils Absolute 0.0 0 - 0.1 K/uL  RPR     Status: None   Collection Time: 03/22/16  5:16 PM  Result Value Ref Range   RPR Ser Ql Non Reactive Non Reactive    Comment: (NOTE) Performed At: [facility] [place], [place] [id number] Lindon Romp MD [id number]:[id number]   [place] rt PCR [facility] only)     Status: None   Collection Time: 03/22/16  5:16 PM  Result Value Ref Range   Specimen source GC/Chlam URINE, RANDOM    Chlamydia Tr NOT DETECTED NOT DETECTED   N gonorrhoeae NOT DETECTED NOT DETECTED    Comment: (NOTE) 100  This methodology has not been evaluated in pregnant women or in 200  patients with a history of hysterectomy. 300 400  This methodology will  not be performed on patients less than 26  years of age.   Type and screen [place]     Status: None   Collection Time: 03/22/16  5:17 PM  Result Value Ref Range   ABO/RH(D) A POS    Antibody Screen NEG    Sample Expiration 03/25/2016   ABO/Rh     Status: None   Collection Time: 03/22/16  5:18 PM  Result Value Ref Range   ABO/RH(D) A POS   Protein / creatinine ratio, urine     Status: Abnormal   Collection Time: 03/22/16  5:20 PM  Result Value Ref Range   Creatinine, Urine 69 mg/dL   Total Protein, Urine 16 mg/dL    Comment: NO NORMAL RANGE ESTABLISHED FOR THIS TEST   Protein Creatinine Ratio 0.23 (H) 0.00 - 0.15 mg/mg[Cre]  Comprehensive metabolic panel     Status: Abnormal   Collection Time: 03/23/16  4:57 AM  Result Value Ref Range   Sodium 136 135 - 145 mmol/L   Potassium 2.9 (LL) 3.5 - 5.1 mmol/L    Comment: CRITICAL RESULT CALLED TO, READ BACK BY AND VERIFIED WITH TERRI SMITH @ 0603 ON 03/23/2016  BY CAF    Chloride 107 101 - 111 mmol/L   CO2 23 22 - 32 mmol/L   Glucose, Bld 132 (H) 65 - 99 mg/dL   BUN 6 6 - 20 mg/dL   Creatinine, Ser 0.40 (L) 0.44 - 1.00 mg/dL   Calcium 8.6 (L) 8.9 - 10.3 mg/dL   Total Protein 5.7 (L) 6.5 - 8.1 g/dL   Albumin 2.5 (L) 3.5 - 5.0 g/dL   AST 21 15 - 41 U/L   ALT 14 14 - 54 U/L   Alkaline Phosphatase 184 (H) 38 - 126 U/L   Total Bilirubin 0.4 0.3 - 1.2 mg/dL   GFR calc non Af Amer >60 >60 mL/min   GFR calc Af Amer >60 >60 mL/min    Comment: (NOTE) The eGFR has been calculated using the CKD EPI equation. This calculation has not been validated in all clinical situations. eGFR's persistently <60 mL/min signify possible Chronic Kidney Disease.    Anion gap 6 5 - 15  CBC     Status: Abnormal   Collection Time: 03/23/16  4:57 AM  Result Value Ref Range   WBC 15.5 (H) 3.6 - 11.0 K/uL   RBC 4.29 3.80 - 5.20 MIL/uL   Hemoglobin 10.5 (L) 12.0 - 16.0 g/dL   HCT 32.6 (L) 35.0 - 47.0 %   MCV 76.0 (L) 80.0 - 100.0 fL    MCH 24.4 (L) 26.0 - 34.0 pg   MCHC 32.0 32.0 - 36.0 g/dL   RDW 17.8 (H) 11.5 - 14.5 %   Platelets 142 (L) 150 - 440 K/uL  Potassium     Status: None   Collection Time: 03/23/16 12:58 PM  Result Value Ref Range   Potassium 4.0 3.5 - 5.1 mmol/L    Assessment:   25 y.o. [id number] postpartum day # 2TSVD, GHTN (resolved)  Plan:    1) Min pain  2) A POS, Rubella Immune/ Varicella Immune  3) TDAP status up to date  4) Bottle/Nexplanon  5) Hypokalemia - resolved  6) GHTN - normotensive to mild range since delivery, will monitor BP's  7) Disposition anticipate discharge today

## 2016-03-24 NOTE — Progress Notes (Signed)
Patient understands all discharge instructions and the need to make follow up appointments. Patient discharge via wheelchair with auxillary. 

## 2016-03-24 NOTE — Discharge Instructions (Signed)

## 2017-05-05 IMAGING — US US OB COMP +14 WK
1 series · 14 of 28 positions shown · non-contrast
Comparison: none

CLINICAL DATA: Pregnancy.

EXAM:
OBSTETRICAL ULTRASOUND >14 WKS

[Series 1: us ob comp +14 wk · 0.25mm/px · 14 of 174 slices shown]
[im 7/174]
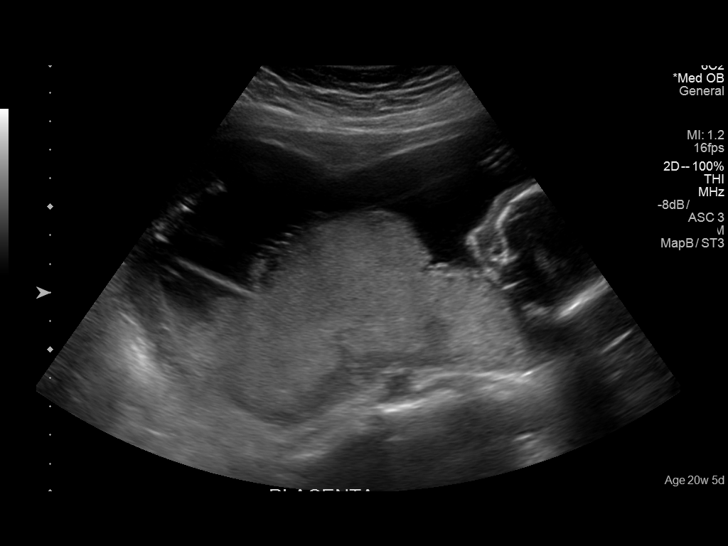
[im 20/174]
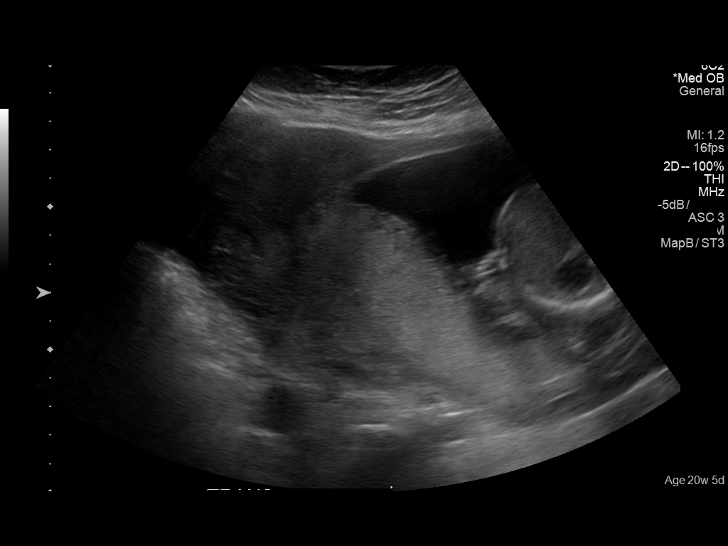
[im 33/174]
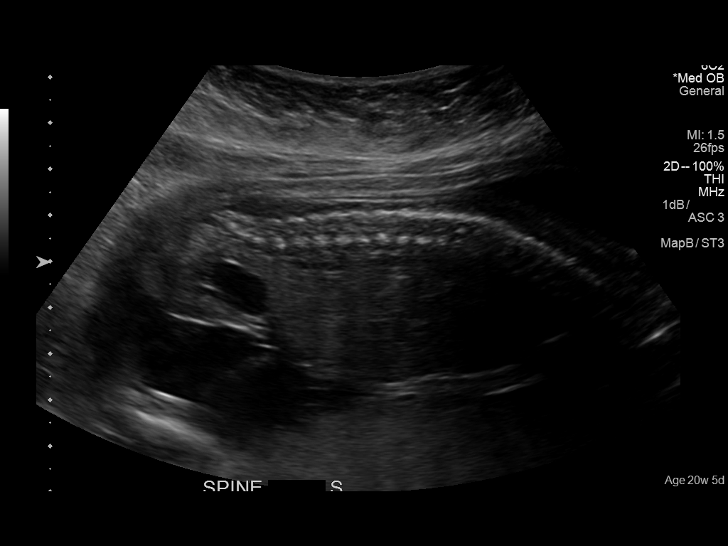
[im 45/174]
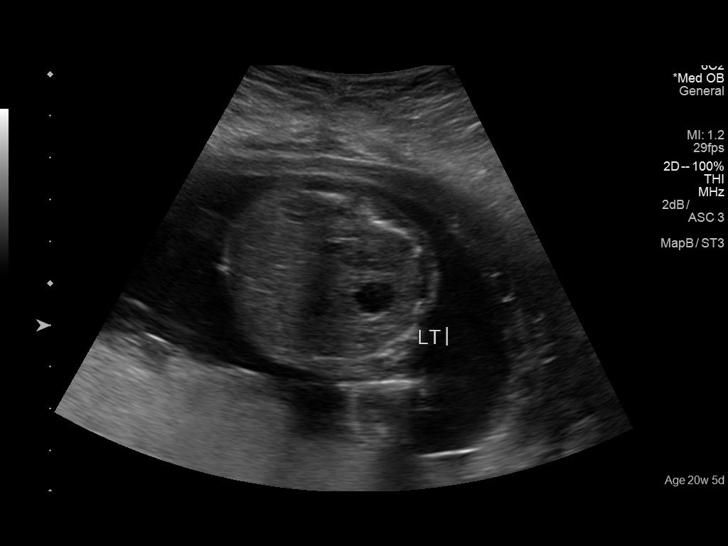
[im 58/174]
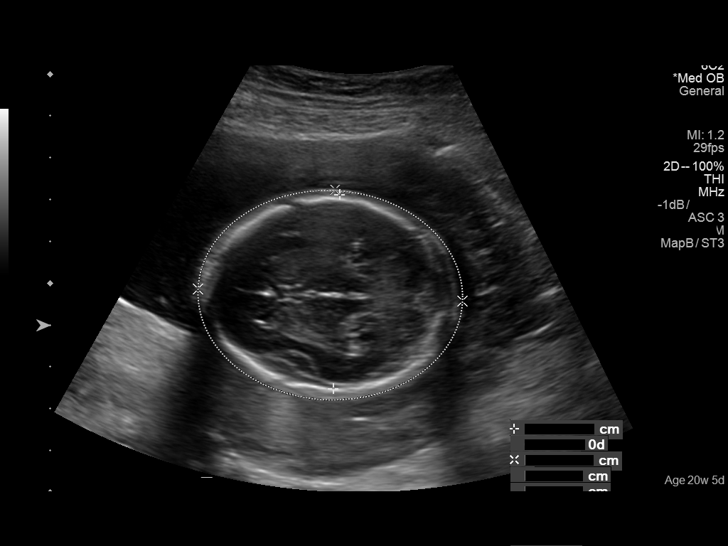
[im 71/174]
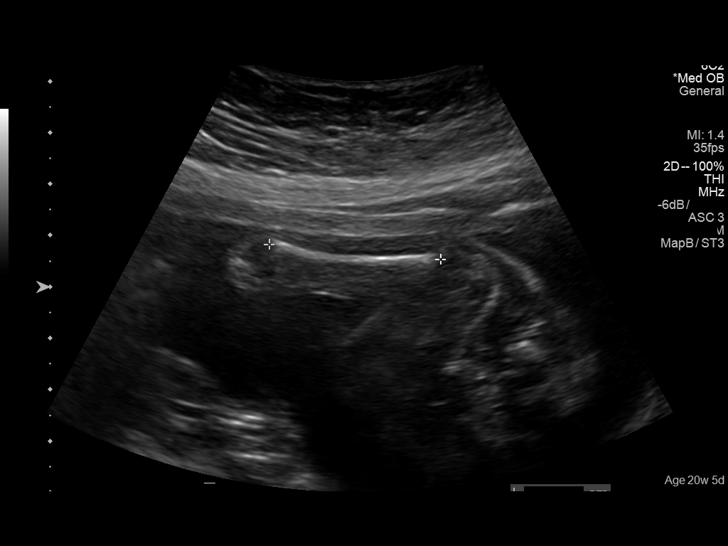
[im 84/174]
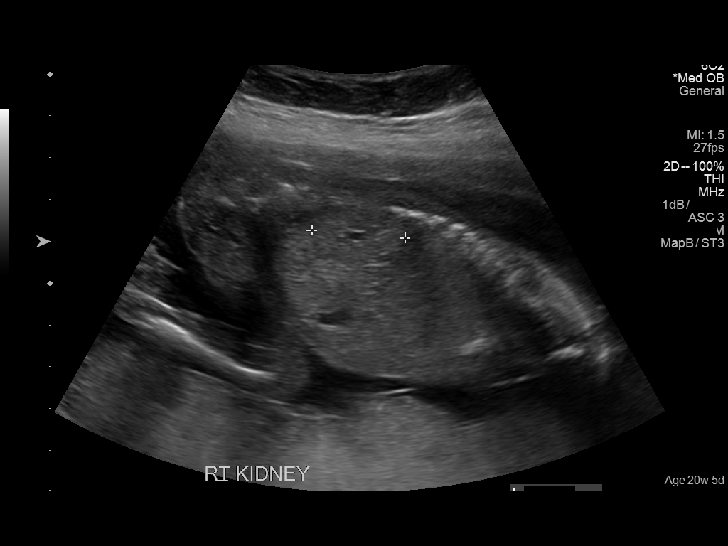
[im 97/174]
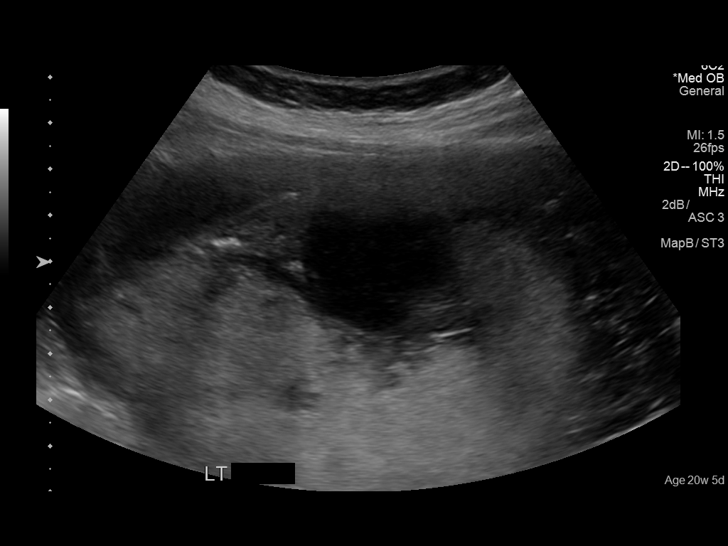
[im 109/174]
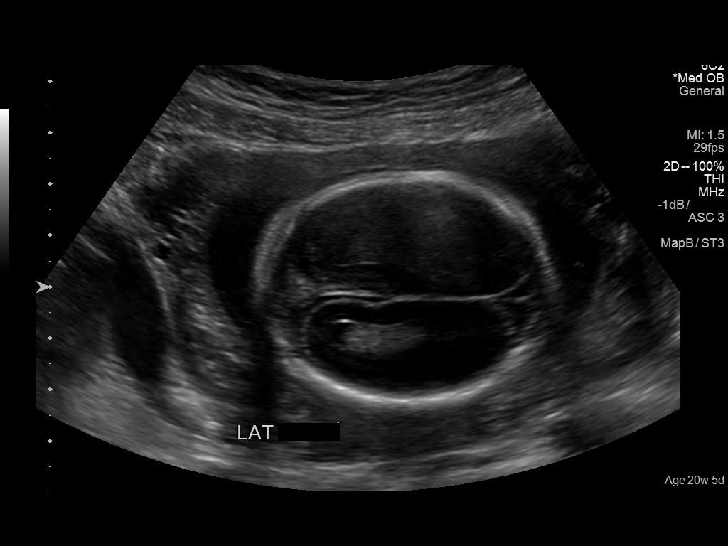
[im 122/174]
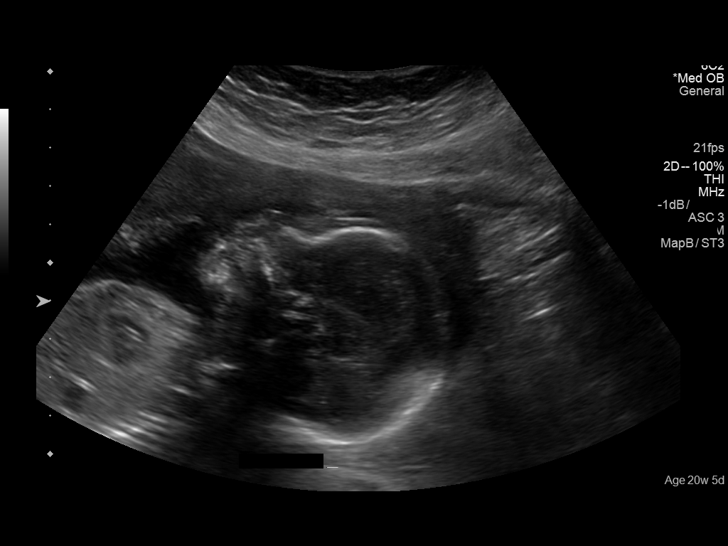
[im 135/174]
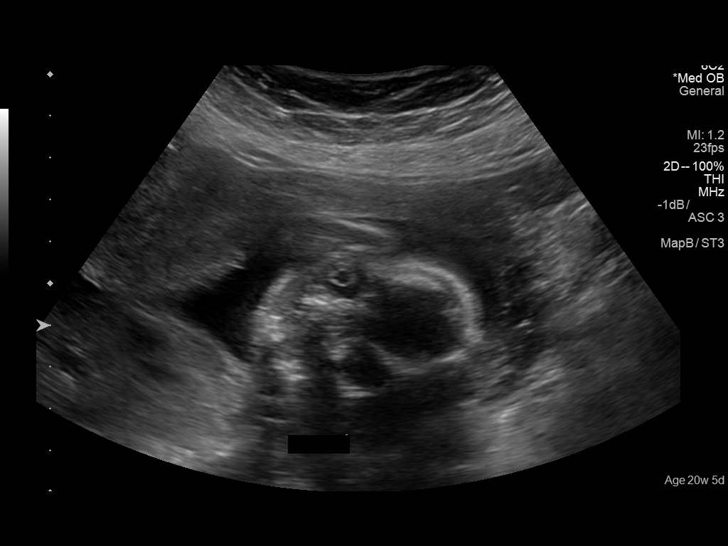
[im 148/174]
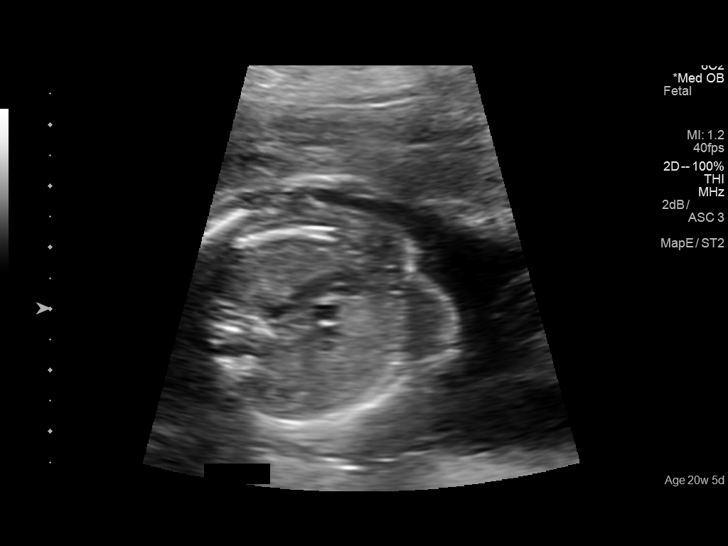
[im 161/174]
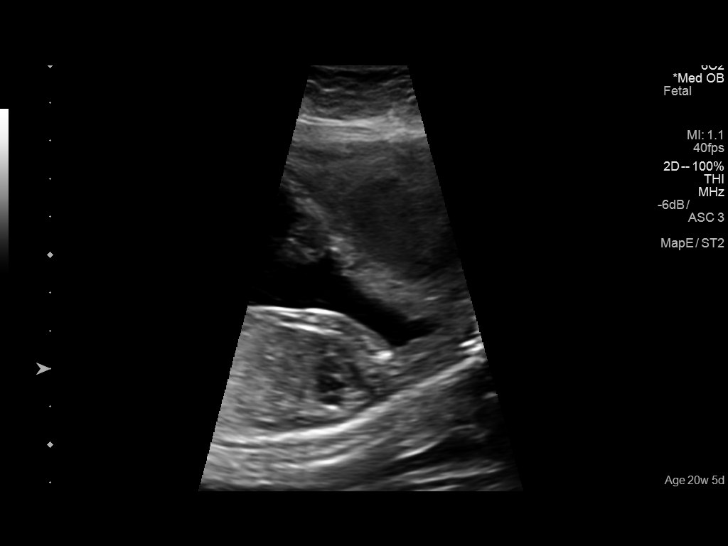
[im 174/174]
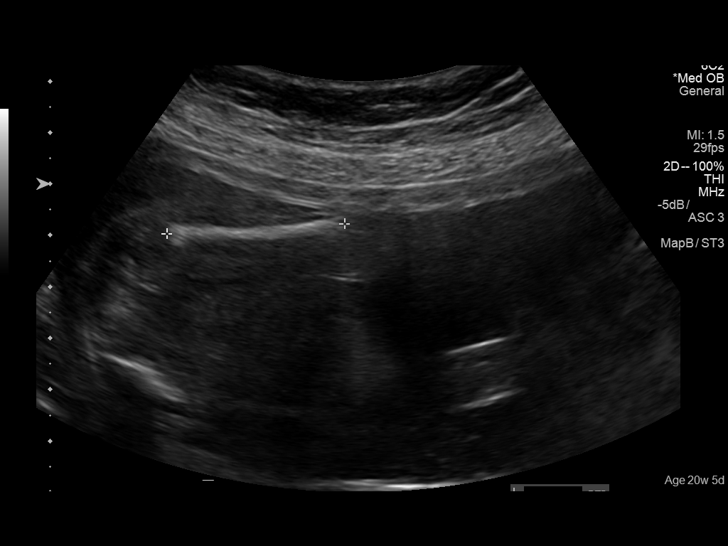

[14 of 28 positions shown; findings below may reference images not displayed]

FINDINGS: Number of Fetuses: 1

Heart Rate:  150 bpm

Movement: Present

Presentation: Cephalic

Previa: No

Placental Location: Posterior

Amniotic Fluid (Subjective): Normal

Amniotic Fluid (Objective):

Vertical pocket 4.2cm

FETAL BIOMETRY

BPD:  4.6cm 20w 1d

HC:    17.9cm  20w   2d

AC:   15.4cm  20w   4d

FL:   3.5cm  20w   6d

Current Mean GA: 20w 5d              US EDC: 03/21/2016

FETAL ANATOMY

Lateral Ventricles: Visualized

Thalami/CSP: Visualized

Posterior Fossa:  Visualized

Nuchal Region: Visualized NFT= 4.5 mm

Upper Lip: Visualized

Spine: Visualized

4 Chamber Heart on Left: Visualized

LVOT: Visualized

RVOT: Visualized

Stomach on Left: Visualized

3 Vessel Cord: Visualized

Cord Insertion site: Visualized

Kidneys: Visualized

Bladder: Visualized

Extremities: Visualized

Maternal Findings:

Cervix:  4.7 cm and closed.
IMPRESSION: Single viable intrauterine pregnancy in cephalic presentation. Mean
gestational age of 20 weeks 5 days.

## 2021-08-27 ENCOUNTER — Encounter: Payer: Self-pay | Admitting: *Deleted
